# Patient Record
Sex: Female | Born: 1970 | Race: White | Hispanic: No | Marital: Married | Smoking: Light tobacco smoker
Health system: Southern US, Community
[De-identification: ages and names within clinical notes are randomized; demographics above are authoritative.]

## PROBLEM LIST (undated history)

## (undated) DIAGNOSIS — R112 Nausea with vomiting, unspecified: Secondary | ICD-10-CM

## (undated) DIAGNOSIS — N83209 Unspecified ovarian cyst, unspecified side: Secondary | ICD-10-CM

## (undated) DIAGNOSIS — Z789 Other specified health status: Secondary | ICD-10-CM

## (undated) DIAGNOSIS — Z9889 Other specified postprocedural states: Secondary | ICD-10-CM

## (undated) HISTORY — PX: BREAST REDUCTION SURGERY: SHX8

## (undated) HISTORY — PX: TONSILLECTOMY: SUR1361

## (undated) HISTORY — PX: TONSILLECTOMY: SHX5217

---
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
## 1998-07-06 ENCOUNTER — Other Ambulatory Visit: Admission: RE | Admit: 1998-07-06 | Discharge: 1998-07-06 | Payer: Self-pay | Admitting: Obstetrics and Gynecology

## 1998-09-07 ENCOUNTER — Ambulatory Visit (HOSPITAL_COMMUNITY): Admission: RE | Admit: 1998-09-07 | Discharge: 1998-09-07 | Payer: Self-pay

## 2000-04-30 ENCOUNTER — Other Ambulatory Visit: Admission: RE | Admit: 2000-04-30 | Discharge: 2000-04-30 | Payer: Self-pay | Admitting: Obstetrics and Gynecology

## 2006-01-28 ENCOUNTER — Other Ambulatory Visit: Admission: RE | Admit: 2006-01-28 | Discharge: 2006-01-28 | Payer: Self-pay | Admitting: Obstetrics & Gynecology

## 2007-08-02 ENCOUNTER — Inpatient Hospital Stay (HOSPITAL_COMMUNITY): Admission: AD | Admit: 2007-08-02 | Discharge: 2007-08-04 | Payer: Self-pay | Admitting: Obstetrics and Gynecology

## 2007-08-03 ENCOUNTER — Encounter (INDEPENDENT_AMBULATORY_CARE_PROVIDER_SITE_OTHER): Payer: Self-pay | Admitting: Obstetrics and Gynecology

## 2009-03-25 ENCOUNTER — Encounter: Admission: RE | Admit: 2009-03-25 | Discharge: 2009-03-25 | Payer: Self-pay | Admitting: Obstetrics and Gynecology

## 2009-10-19 ENCOUNTER — Encounter: Payer: Self-pay | Admitting: Cardiology

## 2009-12-06 ENCOUNTER — Encounter (INDEPENDENT_AMBULATORY_CARE_PROVIDER_SITE_OTHER): Payer: Self-pay | Admitting: *Deleted

## 2010-08-01 ENCOUNTER — Inpatient Hospital Stay (HOSPITAL_COMMUNITY): Admission: AD | Admit: 2010-08-01 | Discharge: 2010-08-01 | Payer: Self-pay | Admitting: Obstetrics and Gynecology

## 2010-08-06 ENCOUNTER — Inpatient Hospital Stay (HOSPITAL_COMMUNITY): Admission: AD | Admit: 2010-08-06 | Discharge: 2010-08-06 | Payer: Self-pay | Admitting: Obstetrics and Gynecology

## 2010-08-08 ENCOUNTER — Inpatient Hospital Stay (HOSPITAL_COMMUNITY): Admission: AD | Admit: 2010-08-08 | Discharge: 2010-08-09 | Payer: Self-pay | Admitting: Obstetrics and Gynecology

## 2010-08-08 ENCOUNTER — Encounter (INDEPENDENT_AMBULATORY_CARE_PROVIDER_SITE_OTHER): Payer: Self-pay | Admitting: Obstetrics and Gynecology

## 2010-08-09 ENCOUNTER — Encounter: Admission: RE | Admit: 2010-08-09 | Discharge: 2010-08-18 | Payer: Self-pay | Admitting: Obstetrics and Gynecology

## 2010-12-04 ENCOUNTER — Encounter: Payer: Self-pay | Admitting: Obstetrics and Gynecology

## 2010-12-12 NOTE — Letter (Signed)
Summary: Appointment - Missed  [facility]     [facility] [place], [place]    Phone:   Fax:      December 06, 2009 MRN: [id number]   [facility] [address] [place], [place]  [address]   Dear Ms. Katherine Thomas,  Our records indicate you missed your appointment on  12-02-2009   with  Dr.   Daleen Squibb   It is very important that we reach you to reschedule this appointment. We look forward to participating in your health care needs. Please contact us at the number listed above at your earliest convenience to reschedule this appointment.     Sincerely,   Lorne Skeens  [facility] Scheduling Team

## 2010-12-12 NOTE — Letter (Signed)
Summary: [facility] Office Note  [facility] Office Note   Imported By: Roderic Ovens 12/12/2009 16:37:59  _____________________________________________________________________  External Attachment:    Type:   Image     Comment:   External Document

## 2011-01-25 LAB — CBC
HCT: 38.3 % (ref 36.0–46.0)
MCH: 36.3 pg — ABNORMAL HIGH (ref 26.0–34.0)
MCHC: 34.5 g/dL (ref 30.0–36.0)
MCV: 102.7 fL — ABNORMAL HIGH (ref 78.0–100.0)
Platelets: 226 10*3/uL (ref 150–400)
Platelets: 255 10*3/uL (ref 150–400)
RDW: 13.4 % (ref 11.5–15.5)
RDW: 13.7 % (ref 11.5–15.5)
WBC: 18.4 10*3/uL — ABNORMAL HIGH (ref 4.0–10.5)
WBC: 21.6 10*3/uL — ABNORMAL HIGH (ref 4.0–10.5)

## 2011-01-25 LAB — AMNISURE RUPTURE OF MEMBRANE (ROM) NOT AT ARMC: Amnisure ROM: NEGATIVE

## 2011-08-23 LAB — CBC
HCT: 30.7 — ABNORMAL LOW
Hemoglobin: 10.6 — ABNORMAL LOW
Hemoglobin: 12.5
MCHC: 34.5
MCV: 100.9 — ABNORMAL HIGH
Platelets: 282
Platelets: 307
RBC: 3.04 — ABNORMAL LOW
RBC: 3.39 — ABNORMAL LOW
RDW: 14.4 — ABNORMAL HIGH
WBC: 16.4 — ABNORMAL HIGH
WBC: 20.5 — ABNORMAL HIGH

## 2011-08-23 LAB — COMPREHENSIVE METABOLIC PANEL
ALT: 17
AST: 21
Albumin: 2.8 — ABNORMAL LOW
Alkaline Phosphatase: 135 — ABNORMAL HIGH
Chloride: 101
GFR calc Af Amer: >60
Potassium: 3.7
Total Bilirubin: 0.5

## 2011-08-23 LAB — RPR: RPR Ser Ql: NONREACTIVE

## 2011-09-30 ENCOUNTER — Encounter: Payer: Self-pay | Admitting: *Deleted

## 2011-09-30 ENCOUNTER — Emergency Department (HOSPITAL_COMMUNITY)
Admission: EM | Admit: 2011-09-30 | Discharge: 2011-09-30 | Disposition: A | Discharge status: Home / Self Care | Payer: 59 | Attending: Emergency Medicine | Admitting: Emergency Medicine

## 2011-09-30 ENCOUNTER — Other Ambulatory Visit: Payer: Self-pay

## 2011-09-30 DIAGNOSIS — R5381 Other malaise: Secondary | ICD-10-CM | POA: Insufficient documentation

## 2011-09-30 DIAGNOSIS — R42 Dizziness and giddiness: Secondary | ICD-10-CM | POA: Insufficient documentation

## 2011-09-30 DIAGNOSIS — R110 Nausea: Secondary | ICD-10-CM | POA: Insufficient documentation

## 2011-09-30 DIAGNOSIS — R55 Syncope and collapse: Secondary | ICD-10-CM | POA: Insufficient documentation

## 2011-09-30 DIAGNOSIS — R61 Generalized hyperhidrosis: Secondary | ICD-10-CM | POA: Insufficient documentation

## 2011-09-30 MED ORDER — SODIUM CHLORIDE 0.9 % IV BOLUS (SEPSIS)
1000.0000 mL | Freq: Once | INTRAVENOUS | Status: AC
Start: 2011-09-30 — End: 2011-09-30
  Administered 2011-09-30: 1000 mL via INTRAVENOUS

## 2011-09-30 MED ORDER — SODIUM CHLORIDE 0.9 % IV SOLN
1000.0000 mL | INTRAVENOUS | Status: AC
  Administered 2011-09-30: 1000 mL via INTRAVENOUS

## 2011-09-30 MED ORDER — ONDANSETRON HCL 4 MG/2ML IJ SOLN
INTRAMUSCULAR | Status: AC
  Administered 2011-09-30: 4 mg
  Filled 2011-09-30: qty 2

## 2011-09-30 MED ORDER — AEROCHAMBER PLUS W/MASK MISC
Status: AC
  Filled 2011-09-30: qty 1

## 2011-09-30 MED ORDER — ALBUTEROL SULFATE HFA 108 (90 BASE) MCG/ACT IN AERS
INHALATION_SPRAY | RESPIRATORY_TRACT | Status: AC
  Filled 2011-09-30: qty 6.7

## 2011-09-30 NOTE — ED Notes (Signed)
C/o diarrhea x 2 days, avg twice daily. Denies n/v, admits to not drinking enough fluids lately. Also reports "under a lot of stress lately". Denies Cp, palpitations, heart racing, abd pain. 10 min prior to syncopal episode, felt hot, dizzy, lightheaded.

## 2011-09-30 NOTE — ED Provider Notes (Signed)
History     CSN: [id number] Arrival date & time: 09/30/2011  2:34 PM   First MD Initiated Contact with Patient 09/30/11 1452      Chief Complaint  Patient presents with  . 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 She started getting IV fluids from EMS and already has started improving. The episode was gradual onset it has lasted for less than an hour prior to arrival. Her symptoms are improving with IV fluids from EMS. Her symptoms were moderately severe and now mild. No past medical history on file.  No past surgical history on file.  No family history on file.  History  Substance Use Topics  . Smoking status: Not on file  . Smokeless tobacco: Not on file  . Alcohol Use: Not on file    OB History    Grav Para Term Preterm Abortions TAB SAB Ect Mult Living                  Review of Systems  Constitutional: Negative for fever.       10 Systems reviewed and are negative for acute change except as noted in the HPI.  HENT: Negative for congestion.   Eyes: Negative for discharge and redness.  Respiratory: Negative for cough and shortness of breath.   Cardiovascular: Negative for chest pain, palpitations and leg swelling.  Gastrointestinal: Positive for  nausea. Negative for vomiting and abdominal pain.  Musculoskeletal: Negative for back pain.  Skin: Negative for rash.  Neurological: Positive for weakness and light-headedness. Negative for syncope, facial asymmetry, speech difficulty, numbness and headaches.  Psychiatric/Behavioral:       No behavior change.    Allergies  Review of patient's allergies indicates no known allergies.  Home Medications   Current Outpatient Rx  Name Route Sig Dispense Refill  . ACETAMINOPHEN 325 MG PO TABS Oral Take 650 mg by mouth once.        BP 123/84  Pulse 94  Temp(Src) 97.5 F (36.4 C) (Oral)  Resp 21  SpO2 100%  Physical Exam  Nursing note and vitals reviewed. Constitutional: She is oriented to person, place, and time.       Awake, alert, nontoxic appearance with baseline speech for patient.  HENT:  Head: Atraumatic.  Mouth/Throat: No oropharyngeal exudate.  Eyes: EOM are normal. Pupils are equal, round, and reactive to light. Right eye exhibits no discharge. Left eye exhibits no discharge.  Neck: Neck supple.  Cardiovascular: Normal rate and regular rhythm.   No murmur heard. Pulmonary/Chest: Effort normal and breath sounds normal. No stridor. No respiratory distress. She has no wheezes. She has no rales. She exhibits no tenderness.  Abdominal: Soft. Bowel sounds are normal. She exhibits no mass. There is no tenderness. There is no rebound.  Musculoskeletal: She exhibits no tenderness.       Baseline ROM, moves  extremities with no obvious new focal weakness.  Lymphadenopathy:    She has no cervical adenopathy.  Neurological: She is alert and oriented to person, place, and time.       Awake, alert, cooperative and aware of situation; motor strength bilaterally; sensation normal to light touch bilaterally; peripheral visual fields full to confrontation; no facial asymmetry; tongue midline; major cranial nerves appear intact; no pronator drift, normal finger to nose bilaterally  Skin: No  rash noted.  Psychiatric: She has a normal mood and affect.    ED Course  Procedures (including critical care time) ECG: Sinus rhythm, ventricular rate 86 beats per minute, normal axis, normal intervals, no acute ischemic changes noted, no comparison ECG available.  Pt feels improved after observation and/or treatment in ED.Patient / Familyinformed of clinical course, understand medical decision-making process, and agree with plan. Labs Reviewed - No data to display No results found.   1. Near syncope       MDM  I doubt any other EMC precluding discharge at this time including, but not necessarily limited to the following:SAH, CVA, ACS, SBI.        Hurman Horn, MD 09/30/11 [phone]

## 2011-09-30 NOTE — ED Notes (Signed)
Reports dizziness & lightheadedness improved after fluids. Denies pain.Marland Kitchen NAD. VSS

## 2011-09-30 NOTE — ED Notes (Signed)
Reports nausea upon standing, denies dizziness/lightheadedness. Medicated per order

## 2012-08-13 ENCOUNTER — Ambulatory Visit (INDEPENDENT_AMBULATORY_CARE_PROVIDER_SITE_OTHER): Payer: 59 | Admitting: Cardiology

## 2012-08-13 ENCOUNTER — Other Ambulatory Visit: Payer: Self-pay | Admitting: *Deleted

## 2012-08-13 ENCOUNTER — Encounter: Payer: Self-pay | Admitting: Cardiology

## 2012-08-13 VITALS — BP 144/94 | HR 93 | Ht 67.0 in | Wt 126.0 lb

## 2012-08-13 DIAGNOSIS — I10 Essential (primary) hypertension: Secondary | ICD-10-CM

## 2012-08-13 DIAGNOSIS — R002 Palpitations: Secondary | ICD-10-CM

## 2012-08-13 DIAGNOSIS — R079 Chest pain, unspecified: Secondary | ICD-10-CM

## 2012-08-13 MED ORDER — TRAZODONE HCL 50 MG PO TABS: ORAL_TABLET | ORAL | Status: DC

## 2012-08-13 NOTE — Patient Instructions (Addendum)
Your physician has requested that you have an echocardiogram. Echocardiography is a painless test that uses sound waves to create images of your heart. It provides your doctor with information about the size and shape of your heart and how well your heart's chambers and valves are working. This procedure takes approximately one hour. There are no restrictions for this procedure.  Your physician has requested that you have a stress echocardiogram. For further information please visit [email or web address] Please follow instruction sheet as given.  Your physician has recommended that you wear a holter monitor. Holter monitors are medical devices that record the heart's electrical activity. Doctors most often use these monitors to diagnose arrhythmias. Arrhythmias are problems with the speed or rhythm of the heartbeat. The monitor is a small, portable device. You can wear one while you do your normal daily activities. This is usually used to diagnose what is causing palpitations/syncope (passing out). 48 hour monitor  Your physician recommends that you return for a FASTING NMR lipoprofile profile.   Use trazodone 50mg  at bedtime for sleep as you need it. Additional refills should be discussed with your primary care or other provider.   Your physician recommends that you schedule a follow-up appointment in: 2 weeks with Dr Shirlee Latch after all testing has been completed.

## 2012-08-13 NOTE — Progress Notes (Signed)
Patient ID: Katherine Thomas, female   DOB: [birth year], 41 y.o.   MRN: [id number] 41 yo with history of palpitations and left shoulder pain presents for cardiology evaluation.  Patient has had a long history of periodic palpitations (like her heart fluttering).  However, for the last few months, she has been having more frequent palpitations.  She will feel her heart "flutter," and sometimes she will feel her heart beat "hard."  She denies tachycardia.  Palpitations are bothersome.  No lightheadedness or syncope.  She did have an episode of presyncope in 11/12 at the [facility] (felt warm, diaphoretic, lightheaded, nauseated).    Additionally, she has been having pain in her left shoulder radiating down her left arm and into her chest . This will last 10 minutes at a time.  It does not seem to be related to exertion.  She does not get exertional chest pain or dyspnea.  She walks on the treadmill at the gym and lifts weights for exercise.    Patient has been under a lot of stress recently.  Her sister died not long ago of a subarachnoid hemorrhage.  She has 2 young children.  She only sleeps 4-5 hours/night.  She drinks about 5 cups of coffee/day.    ECG: NSR, normal  PMH: 1. Palpitations 2. Presyncope (11/12): presumed vasovagal.   SH: Married with 2 children.  Works as an Nutritional therapist.  Smoked 1/2 ppd, quit in 9/13.    FH: Father with massive MI at 18 (died).  Sister with SAH at 82.  Mother witih AAA repair.  Grandmother and grandfather both had atrial fibrillation.   ROS: All systems reviewed and negative except as per HPI.   Current Outpatient Prescriptions  Medication Sig Dispense Refill  . acetaminophen (TYLENOL) 325 MG tablet Take 650 mg by mouth every 6 (six) hours as needed.       Marland Kitchen aspirin 81 MG tablet Take 81 mg by mouth daily.      . traZODone (DESYREL) 50 MG tablet 1 at bedtime as needed for sleep  30 tablet  0    BP 144/94  Pulse 93  Ht 5\' 7"  (1.702 m)  Wt 126 lb (57.153  kg)  BMI 19.73 kg/m2  SpO2 96% General: NAD Neck: No JVD, no thyromegaly or thyroid nodule.  Lungs: Clear to auscultation bilaterally with normal respiratory effort. CV: Nondisplaced PMI.  Heart regular S1/S2, no S3/S4, no murmur.  No peripheral edema.  No carotid bruit.  Normal pedal pulses.  Abdomen: Soft, nontender, no hepatosplenomegaly, no distention.  Skin: Intact without lesions or rashes.  Neurologic: Alert and oriented x 3.  Psych: Normal affect. Extremities: No clubbing or cyanosis.  HEENT: Normal.    Assessment/Plan: 1. Palpitations: Her symptoms sound like PVCs or PACs.  Likely they have been triggered by increased stress, lack of sleep, excessive caffeine.  She does have a family history of atrial fibrillation.  - 48 hour holter monitor to confirm premature beats, make sure we don't see atrial fibrillation.  - Cut back on caffeine, try to exercise and sleep more.  She wants something to help with sleep but had side effects with Ambien and had no improvement with melatonin.  I will let her try trazodone 50 mg daily.  - Check TSH.  - Echocardiogram. 2. Chest/left arm/shoulder pain: Atypical.  However, patient does have a strong family history of CAD.  I will have her get a stress echo.  I will also get a Lipomed profile.  3. High blood pressure:  No history of HTN.  Suspect that this may be a stress response to coming to the doctor's office.   Followup in 2 wks.

## 2012-08-14 ENCOUNTER — Encounter: Payer: Self-pay | Admitting: Cardiology

## 2012-08-14 ENCOUNTER — Ambulatory Visit (HOSPITAL_COMMUNITY): Payer: BC Managed Care – PPO | Attending: Cardiology | Admitting: Radiology

## 2012-08-14 ENCOUNTER — Telehealth: Payer: Self-pay | Admitting: Cardiology

## 2012-08-14 ENCOUNTER — Other Ambulatory Visit: Payer: BC Managed Care – PPO

## 2012-08-14 ENCOUNTER — Other Ambulatory Visit (INDEPENDENT_AMBULATORY_CARE_PROVIDER_SITE_OTHER): Payer: BC Managed Care – PPO

## 2012-08-14 DIAGNOSIS — R079 Chest pain, unspecified: Secondary | ICD-10-CM

## 2012-08-14 DIAGNOSIS — R002 Palpitations: Secondary | ICD-10-CM

## 2012-08-14 DIAGNOSIS — I379 Nonrheumatic pulmonary valve disorder, unspecified: Secondary | ICD-10-CM | POA: Insufficient documentation

## 2012-08-14 NOTE — Progress Notes (Signed)
Echocardiogram performed.  

## 2012-08-14 NOTE — Telephone Encounter (Signed)
New Problem: ° ° ° °Patient called in returning Anne's call.  Please call back. °

## 2012-08-14 NOTE — Telephone Encounter (Signed)
Talked with pt and she thought Thurston Hole might have been calling b/c she added a tsh to her lab work today. She has had labs completed. No further questions Mylo Red RN

## 2012-08-19 ENCOUNTER — Other Ambulatory Visit: Payer: Self-pay | Admitting: *Deleted

## 2012-08-19 DIAGNOSIS — R079 Chest pain, unspecified: Secondary | ICD-10-CM

## 2012-08-20 ENCOUNTER — Other Ambulatory Visit: Payer: Self-pay | Admitting: *Deleted

## 2012-08-20 DIAGNOSIS — I10 Essential (primary) hypertension: Secondary | ICD-10-CM

## 2012-08-20 DIAGNOSIS — R079 Chest pain, unspecified: Secondary | ICD-10-CM

## 2012-08-20 DIAGNOSIS — R002 Palpitations: Secondary | ICD-10-CM

## 2012-08-20 MED ORDER — ROSUVASTATIN CALCIUM 5 MG PO TABS: 5.0000 mg | ORAL_TABLET | Freq: Every day | ORAL | Status: DC

## 2012-08-22 ENCOUNTER — Other Ambulatory Visit (HOSPITAL_COMMUNITY): Payer: BC Managed Care – PPO

## 2012-08-29 ENCOUNTER — Ambulatory Visit: Payer: BC Managed Care – PPO | Admitting: Cardiology

## 2012-09-08 ENCOUNTER — Encounter: Payer: BC Managed Care – PPO | Admitting: Physician Assistant

## 2012-09-08 ENCOUNTER — Encounter (INDEPENDENT_AMBULATORY_CARE_PROVIDER_SITE_OTHER): Payer: BC Managed Care – PPO

## 2012-09-08 ENCOUNTER — Ambulatory Visit (INDEPENDENT_AMBULATORY_CARE_PROVIDER_SITE_OTHER): Payer: BC Managed Care – PPO | Admitting: Physician Assistant

## 2012-09-08 DIAGNOSIS — R079 Chest pain, unspecified: Secondary | ICD-10-CM

## 2012-09-08 DIAGNOSIS — R002 Palpitations: Secondary | ICD-10-CM

## 2012-09-08 NOTE — Procedures (Signed)
Exercise Treadmill Test  Pre-Exercise Testing Evaluation Rhythm: normal sinus  Rate: 90   PR:  .14 QRS:  .08  QT:  .36 QTc: .44     Test  Exercise Tolerance Test Ordering MD: Marca Ancona, MD  Interpreting MD: Tereso Newcomer PA-C  Unique Test No: 1  Treadmill:  1  Indication for ETT: Chest Pain  Contraindication to ETT: No   Stress Modality: exercise - treadmill  Cardiac Imaging Performed: non   Protocol: standard Bruce - maximal  Max BP:  172/104  Max MPHR (bpm):  178 85% MPR (bpm):  151  MPHR obtained (bpm):  153 % MPHR obtained:  88%  Reached 85% MPHR (min:sec):  8:12 Total Exercise Time (min-sec):  10:00  Workload in METS:  11.7 Borg Scale: 15  Reason ETT Terminated:  patient's desire to stop    ST Segment Analysis At Rest: normal ST segments - no evidence of significant ST depression With Exercise: no evidence of significant ST depression  Other Information Arrhythmia:  No Angina during ETT:  absent (0) Quality of ETT:  diagnostic  ETT Interpretation:  normal - no evidence of ischemia by ST analysis  Comments: Good exercise tolerance. No chest pain. Normal BP response to exercise. No ST-T changes to suggest ischemia.  Recommendations: Results d/w Elveria Rising. Follow up with Dr. Marca Ancona as directed. Signed,  Tereso Newcomer, PA-C  11:11 AM 09/08/2012

## 2012-09-15 ENCOUNTER — Telehealth: Payer: Self-pay | Admitting: *Deleted

## 2012-09-15 NOTE — Telephone Encounter (Signed)
FYI Elveria Rising told me at her ETT that she stopped Crestor due to myalgias. Tereso Newcomer, PA-C 11:15 AM 09/08/2012

## 2012-09-15 NOTE — Telephone Encounter (Signed)
Dr Shirlee Latch reviewed monitor done 09/08/12-09/10/12. Rare PACs/ no PVCs/ no atrial fib. No major abnormal findings. Pt advised /verbalized understanding.

## 2012-09-26 ENCOUNTER — Encounter: Payer: Self-pay | Admitting: *Deleted

## 2012-09-29 ENCOUNTER — Ambulatory Visit: Payer: BC Managed Care – PPO | Admitting: Cardiology

## 2012-10-29 ENCOUNTER — Other Ambulatory Visit: Payer: BC Managed Care – PPO

## 2013-06-30 ENCOUNTER — Other Ambulatory Visit: Payer: Self-pay | Admitting: Obstetrics and Gynecology

## 2013-07-03 ENCOUNTER — Encounter (HOSPITAL_COMMUNITY): Payer: Self-pay | Admitting: *Deleted

## 2013-07-03 ENCOUNTER — Inpatient Hospital Stay (HOSPITAL_COMMUNITY)
Admission: AD | Admit: 2013-07-03 | Discharge: 2013-07-03 | Disposition: A | Discharge status: Home / Self Care | Payer: BC Managed Care – PPO | Source: Ambulatory Visit | Attending: Obstetrics and Gynecology | Admitting: Obstetrics and Gynecology

## 2013-07-03 DIAGNOSIS — D649 Anemia, unspecified: Secondary | ICD-10-CM | POA: Insufficient documentation

## 2013-07-03 DIAGNOSIS — R5381 Other malaise: Secondary | ICD-10-CM | POA: Insufficient documentation

## 2013-07-03 HISTORY — DX: Nausea with vomiting, unspecified: R11.2

## 2013-07-03 HISTORY — DX: Other specified postprocedural states: Z98.890

## 2013-07-03 HISTORY — DX: Unspecified ovarian cyst, unspecified side: N83.209

## 2013-07-03 HISTORY — DX: Other specified health status: Z78.9

## 2013-07-03 LAB — CBC WITH DIFFERENTIAL/PLATELET
Basophils Absolute: 0 10*3/uL (ref 0.0–0.1)
Eosinophils Absolute: 0.1 10*3/uL (ref 0.0–0.7)
Eosinophils Relative: 2 % (ref 0–5)
MCH: 32.7 pg (ref 26.0–34.0)
MCV: 97.7 fL (ref 78.0–100.0)
Monocytes Absolute: 0.5 10*3/uL (ref 0.1–1.0)
Platelets: 318 10*3/uL (ref 150–400)
RDW: 14 % (ref 11.5–15.5)

## 2013-07-03 NOTE — MAU Note (Signed)
Pt reports she had a D&C with novasure. Vagina bleeding has stopped but pt feels very weak and dizzy. States she can hear her pulse in her ears and has not slept well for 2 days

## 2013-07-03 NOTE — MAU Provider Note (Signed)
History     CSN: [id number]  Arrival date and time: 07/03/13 1515   First Provider Initiated Contact with Patient 07/03/13 1708      Chief Complaint  Patient presents with  . Fatigue   HPI Katherine Thomas is 42 y.o. [phone] presenting with post op weakness and fatigue.  Had IUD removed early August, no bleeding for the first few days and then she began with heavy bleeding with clots for 8 days.  She saw Dr. Charlynne Pander her hgb and iron were low.  Recommended D&C.  This was done 8/19 with Novasure at the same time by Dr. Rana Snare at surgical center.  Has near syncopal episodes with breaking out in a sweat, ringing of ears.  She is feeling worse-with now hearing her pulse in her ears.  States bleeding has close to stop--a spot today.  Denies abdominal pain, fever or chills.  Took Iron supplement today.    Past Medical History  Diagnosis Date  . Medical history non-contributory   . Ovarian cyst   . PONV (postoperative nausea and vomiting)     Past Surgical History  Procedure Laterality Date  . Breast reduction surgery  1989  . Tonsillectomy    . Breast reduction surgery    . Tonsillectomy      Family History  Problem Relation Age of Onset  . AAA (abdominal aortic aneurysm) Mother   . Heart attack Father 19  . Heart Problems Paternal Grandmother   . Heart Problems Paternal Grandfather   . Heart attack Paternal Aunt 47    CABG    History  Substance Use Topics  . Smoking status: Light Tobacco Smoker  . Smokeless tobacco: Not on file  . Alcohol Use: 0.6 oz/week    1 Glasses of wine per week     Comment: occasional    Allergies:  Allergies  Allergen Reactions  . Percocet [Oxycodone-Acetaminophen] Nausea Only    Patient does not plan to ever take this medication again    Prescriptions prior to admission  Medication Sig Dispense Refill  . acetaminophen (TYLENOL) 325 MG tablet Take 650 mg by mouth every 6 (six) hours as needed for pain.       . IRON PO Take 2 tablets by  mouth daily.      . Multiple Vitamin (MULTIVITAMIN WITH MINERALS) TABS tablet Take 1 tablet by mouth daily.        Review of Systems  Constitutional: Negative for fever.  Gastrointestinal: Negative for nausea, vomiting and abdominal pain.   Physical Exam   Blood pressure 116/72, pulse 94, temperature 97.9 F (36.6 C), temperature source Oral, resp. rate 18, height 5\' 7"  (1.702 m), weight 129 lb 3.2 oz (58.605 kg), SpO2 100.00%.  Physical Exam  Vitals reviewed. Constitutional: She is oriented to person, place, and time. She appears well-developed and well-nourished.  Cardiovascular: Normal rate.   Respiratory: Effort normal.  Genitourinary:  Not indicated  Neurological: She is alert and oriented to person, place, and time.  Skin: Skin is warm and dry. There is pallor.  Psychiatric: She has a normal mood and affect. Her behavior is normal.   Results for orders placed during the hospital encounter of 07/03/13 (from the past 24 hour(s))  CBC WITH DIFFERENTIAL     Status: Abnormal   Collection Time    07/03/13  5:16 PM      Result Value Range   WBC 7.4  4.0 - 10.5 K/uL   RBC 2.66 (*) 3.87 - 5.11  MIL/uL   Hemoglobin 8.7 (*) 12.0 - 15.0 g/dL   HCT 16.1 (*) 09.6 - 04.5 %   MCV 97.7  78.0 - 100.0 fL   MCH 32.7  26.0 - 34.0 pg   MCHC 33.5  30.0 - 36.0 g/dL   RDW 40.9  81.1 - 91.4 %   Platelets 318  150 - 400 K/uL   Neutrophils Relative % 44  43 - 77 %   Neutro Abs 3.3  1.7 - 7.7 K/uL   Lymphocytes Relative 47 (*) 12 - 46 %   Lymphs Abs 3.5  0.7 - 4.0 K/uL   Monocytes Relative 7  3 - 12 %   Monocytes Absolute 0.5  0.1 - 1.0 K/uL   Eosinophils Relative 2  0 - 5 %   Eosinophils Absolute 0.1  0.0 - 0.7 K/uL   Basophils Relative 0  0 - 1 %   Basophils Absolute 0.0  0.0 - 0.1 K/uL   MAU Course  Procedures  MDM 18:10  Reported MSE , labs and vital signs to Dr. Arelia Sneddon   May discharge to home with instructions to continue Fe Replacement and follow up with Dr. Rana Snare on Monday am.     Assessment and Plan  A:  Malaise      Anemia     Post-op D&C and Novasure  P:  Patient instructed to continue Fe Replacement     Follow up with Dr. Rana Snare on Monday  Matt Holmes 07/03/2013, 6:12 PM

## 2014-09-13 ENCOUNTER — Encounter (HOSPITAL_COMMUNITY): Payer: Self-pay | Admitting: *Deleted

## 2021-05-09 ENCOUNTER — Emergency Department (HOSPITAL_BASED_OUTPATIENT_CLINIC_OR_DEPARTMENT_OTHER)
Admission: EM | Admit: 2021-05-09 | Discharge: 2021-05-09 | Disposition: A | Discharge status: Home / Self Care | Payer: 59 | Attending: Emergency Medicine | Admitting: Emergency Medicine

## 2021-05-09 ENCOUNTER — Emergency Department (HOSPITAL_BASED_OUTPATIENT_CLINIC_OR_DEPARTMENT_OTHER): Payer: 59

## 2021-05-09 ENCOUNTER — Encounter (HOSPITAL_BASED_OUTPATIENT_CLINIC_OR_DEPARTMENT_OTHER): Payer: Self-pay | Admitting: *Deleted

## 2021-05-09 ENCOUNTER — Other Ambulatory Visit: Payer: Self-pay

## 2021-05-09 DIAGNOSIS — R1110 Vomiting, unspecified: Secondary | ICD-10-CM | POA: Diagnosis not present

## 2021-05-09 DIAGNOSIS — I10 Essential (primary) hypertension: Secondary | ICD-10-CM | POA: Diagnosis not present

## 2021-05-09 DIAGNOSIS — R42 Dizziness and giddiness: Secondary | ICD-10-CM

## 2021-05-09 DIAGNOSIS — F17200 Nicotine dependence, unspecified, uncomplicated: Secondary | ICD-10-CM | POA: Insufficient documentation

## 2021-05-09 LAB — CBC
HCT: 45.4 % (ref 36.0–46.0)
Hemoglobin: 15.4 g/dL — ABNORMAL HIGH (ref 12.0–15.0)
MCH: 31.9 pg (ref 26.0–34.0)
MCHC: 33.9 g/dL (ref 30.0–36.0)
MCV: 94 fL (ref 80.0–100.0)
Platelets: 362 10*3/uL (ref 150–400)
RBC: 4.83 MIL/uL (ref 3.87–5.11)
RDW: 11.9 % (ref 11.5–15.5)
WBC: 8.6 10*3/uL (ref 4.0–10.5)
nRBC: 0 % (ref 0.0–0.2)

## 2021-05-09 LAB — URINALYSIS, ROUTINE W REFLEX MICROSCOPIC
Bilirubin Urine: NEGATIVE
Glucose, UA: NEGATIVE mg/dL
Ketones, ur: 15 mg/dL — AB
Leukocytes,Ua: NEGATIVE
Nitrite: NEGATIVE
Protein, ur: NEGATIVE mg/dL
Specific Gravity, Urine: 1.011 (ref 1.005–1.030)
pH: 7 (ref 5.0–8.0)

## 2021-05-09 LAB — COMPREHENSIVE METABOLIC PANEL
ALT: 33 U/L (ref 0–44)
AST: 19 U/L (ref 15–41)
Albumin: 4.3 g/dL (ref 3.5–5.0)
Alkaline Phosphatase: 52 U/L (ref 38–126)
Anion gap: 7 (ref 5–15)
BUN: 16 mg/dL (ref 6–20)
CO2: 25 mmol/L (ref 22–32)
Calcium: 9 mg/dL (ref 8.9–10.3)
Chloride: 104 mmol/L (ref 98–111)
Creatinine, Ser: 0.63 mg/dL (ref 0.44–1.00)
GFR, Estimated: >60 mL/min (ref >60)
Glucose, Bld: 90 mg/dL (ref 70–99)
Potassium: 4.1 mmol/L (ref 3.5–5.1)
Sodium: 136 mmol/L (ref 135–145)
Total Bilirubin: 0.8 mg/dL (ref 0.3–1.2)
Total Protein: 7.2 g/dL (ref 6.5–8.1)

## 2021-05-09 LAB — LIPASE, BLOOD: Lipase: 15 U/L (ref 11–51)

## 2021-05-09 LAB — PREGNANCY, URINE: Preg Test, Ur: NEGATIVE

## 2021-05-09 MED ORDER — ONDANSETRON HCL 4 MG/2ML IJ SOLN
4.0000 mg | Freq: Once | INTRAMUSCULAR | Status: AC | PRN
  Administered 2021-05-09: 4 mg via INTRAVENOUS

## 2021-05-09 MED ORDER — MECLIZINE HCL 25 MG PO TABS
25.0000 mg | ORAL_TABLET | Freq: Once | ORAL | Status: AC
  Administered 2021-05-09: 25 mg via ORAL
  Filled 2021-05-09: qty 1

## 2021-05-09 MED ORDER — MECLIZINE HCL 25 MG PO TABS: 25.0000 mg | ORAL_TABLET | Freq: Three times a day (TID) | ORAL | 0 refills | Status: AC | PRN

## 2021-05-09 MED ORDER — ONDANSETRON HCL 4 MG/2ML IJ SOLN
4.0000 mg | Freq: Once | INTRAMUSCULAR | Status: AC
  Administered 2021-05-09: 4 mg via INTRAVENOUS

## 2021-05-09 MED ORDER — SODIUM CHLORIDE 0.9 % IV BOLUS
1000.0000 mL | Freq: Once | INTRAVENOUS | Status: AC
  Administered 2021-05-09: 1000 mL via INTRAVENOUS

## 2021-05-09 NOTE — Discharge Instructions (Addendum)
Work-up and symptoms seem to be consistent with benign positional vertigo.  Make an appointment to follow-up with your primary care doctor.  Take the Antivert as directed.  Return for any new or worse symptoms.

## 2021-05-09 NOTE — ED Provider Notes (Signed)
[facility] EMERGENCY DEPT Provider Note   CSN: [id number] Arrival date & time: 05/09/21  1742     History Chief Complaint  Patient presents with   Dizziness   Emesis    Katherine Thomas is a 50 y.o. female.  Patient and family had COVID 3 weeks ago.  She feels completely recovered from that.  She started with room spinning and dizziness related with vomiting.  No hearing changes.  No visual changes no extremity weakness or numbness.  No speech problems.  Triage mentions some diarrhea this morning.  But certainly no persistent diarrhea.  The symptoms are improved if she lays completely still.  They actually resolved.  If she moves her head left or right they come back on.  Patient is never had anything like this before.      Past Medical History:  Diagnosis Date   Medical history non-contributory    Ovarian cyst    PONV (postoperative nausea and vomiting)     Patient Active Problem List   Diagnosis Date Noted   Palpitations 08/13/2012   Chest pain 08/13/2012   High blood pressure 08/13/2012    Past Surgical History:  Procedure Laterality Date   BREAST REDUCTION SURGERY  1989   BREAST REDUCTION SURGERY     TONSILLECTOMY     TONSILLECTOMY       OB History     Gravida  2   Para  2   Term  2   Preterm      AB      Living  2      SAB      IAB      Ectopic      Multiple      Live Births              Family History  Problem Relation Age of Onset   AAA (abdominal aortic aneurysm) Mother    Heart attack Father 62   Heart Problems Paternal Grandmother    Heart Problems Paternal Grandfather    Heart attack Paternal Aunt 37       CABG    Social History   Tobacco Use   Smoking status: Light Smoker    Pack years: 0.00   Smokeless tobacco: Never  Substance Use Topics   Alcohol use: Yes    Alcohol/week: 1.0 standard drink    Types: 1 Glasses of wine per week    Comment: occasional   Drug use: No    Home Medications Prior  to Admission medications   Medication Sig Start Date End Date Taking? Authorizing Provider  acetaminophen (TYLENOL) 325 MG tablet Take 650 mg by mouth every 6 (six) hours as needed for pain.    Yes [provider]  ibuprofen (ADVIL) 400 MG tablet Take 400 mg by mouth every 6 (six) hours as needed.   Yes [provider]  meclizine (ANTIVERT) 25 MG tablet Take 1 tablet (25 mg total) by mouth 3 (three) times daily as needed for dizziness. 05/09/21  Yes Vanetta Mulders, MD  Multiple Vitamin (MULTIVITAMIN WITH MINERALS) TABS tablet Take 1 tablet by mouth daily.   Yes [provider]  IRON PO Take 2 tablets by mouth daily.    [provider]    Allergies    Percocet [oxycodone-acetaminophen]  Review of Systems   Review of Systems  Constitutional:  Negative for chills and fever.  HENT:  Negative for ear pain and sore throat.   Eyes:  Negative for  pain and visual disturbance.  Respiratory:  Negative for cough and shortness of breath.   Cardiovascular:  Negative for chest pain and palpitations.  Gastrointestinal:  Positive for nausea and vomiting. Negative for abdominal pain.  Genitourinary:  Negative for dysuria and hematuria.  Musculoskeletal:  Negative for arthralgias and back pain.  Skin:  Negative for color change and rash.  Neurological:  Positive for dizziness. Negative for seizures and syncope.  All other systems reviewed and are negative.  Physical Exam Updated Vital Signs BP (!) 143/94 (BP Location: Left Arm)   Pulse 68   Temp 98.4 F (36.9 C)   Resp 13   Ht 1.702 m (5\' 7" )   Wt 54.4 kg   SpO2 99%   BMI 18.79 kg/m   Physical Exam Vitals and nursing note reviewed.  Constitutional:      General: She is not in acute distress.    Appearance: Normal appearance. She is well-developed.  HENT:     Head: Normocephalic and atraumatic.     Right Ear: Tympanic membrane and ear canal normal.     Left Ear: Tympanic membrane and ear canal normal.      Mouth/Throat:     Mouth: Mucous membranes are moist.  Eyes:     Extraocular Movements: Extraocular movements intact.     Conjunctiva/sclera: Conjunctivae normal.     Pupils: Pupils are equal, round, and reactive to light.  Cardiovascular:     Rate and Rhythm: Normal rate and regular rhythm.     Heart sounds: No murmur heard. Pulmonary:     Effort: Pulmonary effort is normal. No respiratory distress.     Breath sounds: Normal breath sounds.  Abdominal:     Palpations: Abdomen is soft.     Tenderness: There is no abdominal tenderness.  Musculoskeletal:        General: No swelling. Normal range of motion.     Cervical back: Normal range of motion and neck supple. No rigidity.  Skin:    General: Skin is warm and dry.     Capillary Refill: Capillary refill takes less than 2 seconds.  Neurological:     General: No focal deficit present.     Mental Status: She is alert and oriented to person, place, and time.     Cranial Nerves: No cranial nerve deficit.     Sensory: No sensory deficit.     Motor: No weakness.     Coordination: Coordination normal.     Comments: The dizziness and room spinning is reproducible by moving her head to the right and then back to the left.    ED Results / Procedures / Treatments   Labs (all labs ordered are listed, but only abnormal results are displayed) Labs Reviewed  CBC - Abnormal; Notable for the following components:      Result Value   Hemoglobin 15.4 (*)    All other components within normal limits  URINALYSIS, ROUTINE W REFLEX MICROSCOPIC - Abnormal; Notable for the following components:   Hgb urine dipstick TRACE (*)    Ketones, ur 15 (*)    Bacteria, UA RARE (*)    All other components within normal limits  LIPASE, BLOOD  COMPREHENSIVE METABOLIC PANEL  PREGNANCY, URINE    EKG EKG Interpretation  Date/Time:  Tuesday May 09 2021 18:04:47 EDT Ventricular Rate:  72 PR Interval:  156 QRS Duration: 82 QT Interval:  384 QTC  Calculation: 421 R Axis:   80 Text Interpretation: Sinus rhythm RAE, consider biatrial  enlargement Confirmed by Vanetta Mulders [phone]) on 05/09/2021 6:16:26 PM  Radiology CT Head Wo Contrast  Result Date: 05/09/2021 CLINICAL DATA:  50 year old female with dizziness and neurologic deficit. EXAM: CT HEAD WITHOUT CONTRAST TECHNIQUE: Contiguous axial images were obtained from the base of the skull through the vertex without intravenous contrast. COMPARISON:  None. FINDINGS: Brain: The ventricles and sulci appropriate size for patient's age. The gray-white matter discrimination is preserved. Small low attenuating foci in the region of the basal ganglia bilaterally, likely perivascular spaces. There is no acute intracranial hemorrhage. No mass effect midline shift. No extra-axial fluid collection. Vascular: No hyperdense vessel or unexpected calcification. Skull: Normal. Negative for fracture or focal lesion. Sinuses/Orbits: There is diffuse mucoperiosteal thickening of paranasal sinuses. No air-fluid level. Mastoid air cells are clear. Other: None IMPRESSION: 1. No acute intracranial pathology. 2. Paranasal sinus disease. Electronically Signed   By: Elgie Collard M.D.   On: 05/09/2021 19:22    Procedures Procedures   Medications Ordered in ED Medications  ondansetron (ZOFRAN) injection 4 mg (4 mg Intravenous Given 05/09/21 1827)  ondansetron (ZOFRAN) injection 4 mg (4 mg Intravenous Given 05/09/21 1814)  meclizine (ANTIVERT) tablet 25 mg (25 mg Oral Given 05/09/21 1831)  sodium chloride 0.9 % bolus 1,000 mL (0 mLs Intravenous Stopped 05/09/21 2001)    ED Course  I have reviewed the triage vital signs and the nursing notes.  Pertinent labs & imaging results that were available during my care of the patient were reviewed by me and considered in my medical decision making (see chart for details).    MDM Rules/Calculators/A&P                          Work-up here without any acute findings.   Urinalysis negative.  Pregnancy test negative.  Lipase normal.  Complete metabolic panel normal.  CBC without a leukocytosis.  Hemoglobin is 15.4.  CT scan of the head without any acute findings.  There is paraspinous disease.  But there is no air-fluid levels nothing suggestive of an acute infection.  Symptoms most likely consistent with benign positional vertigo.  We will have her follow-up with primary care doctor.  Patient treated here with IV fluids and Antivert and feels much better.  We will continue out patient Antivert.  Patient will return for any new or worse symptoms.   Final Clinical Impression(s) / ED Diagnoses Final diagnoses:  Vertigo    Rx / DC Orders ED Discharge Orders          Ordered    meclizine (ANTIVERT) 25 MG tablet  3 times daily PRN        05/09/21 2041             Vanetta Mulders, MD 05/09/21 2046

## 2021-05-09 NOTE — ED Triage Notes (Signed)
Reports dizziness for aboug 3 weeks, emesis started today.Diarrhea this am, Appetite decreased.

## 2023-03-02 IMAGING — CT CT HEAD W/O CM
4 series · 17 of 47 positions shown, 19 images · non-contrast
Comparison: None.

CLINICAL DATA: 50-year-old female with dizziness and neurologic
deficit.

EXAM:
CT HEAD WITHOUT CONTRAST
TECHNIQUE: Contiguous axial images were obtained from the base of the skull
through the vertex without intravenous contrast.

[Series 2: head wo · axial · 0.42mm/px · z∈[-135,-15]mm · 7 of 32 slices shown, 9 images]
[im 4/32  brain]
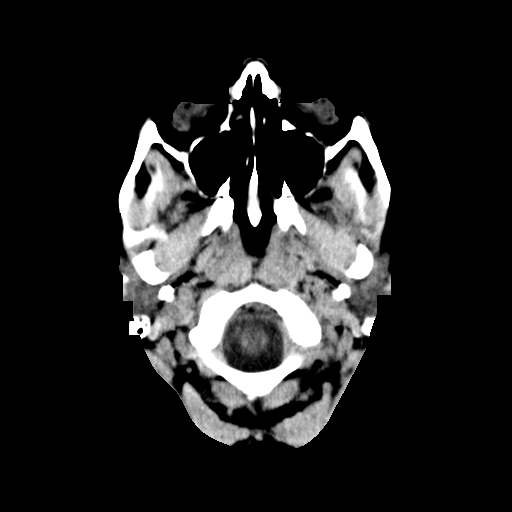
[im 4/32  bone]
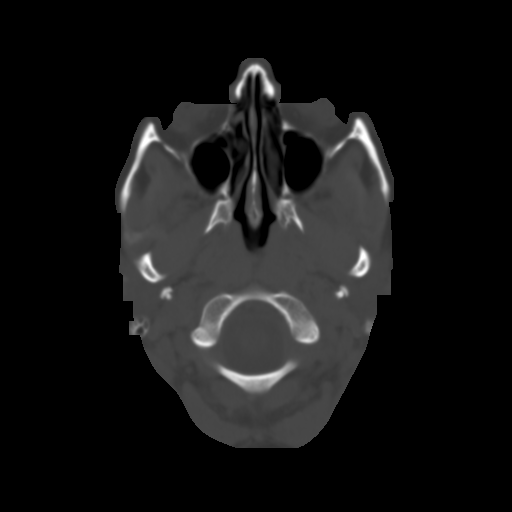
[im 8/32  brain]
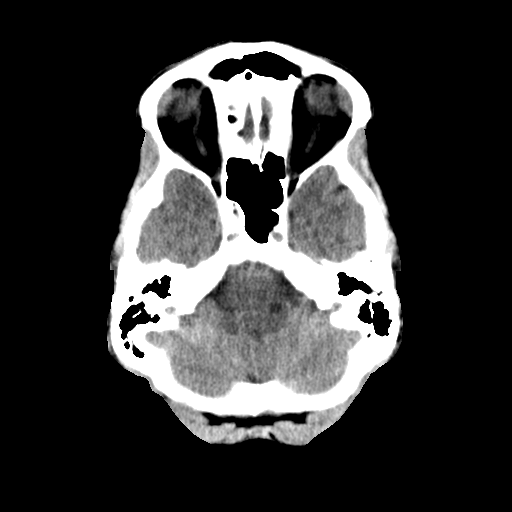
[im 12/32  brain]
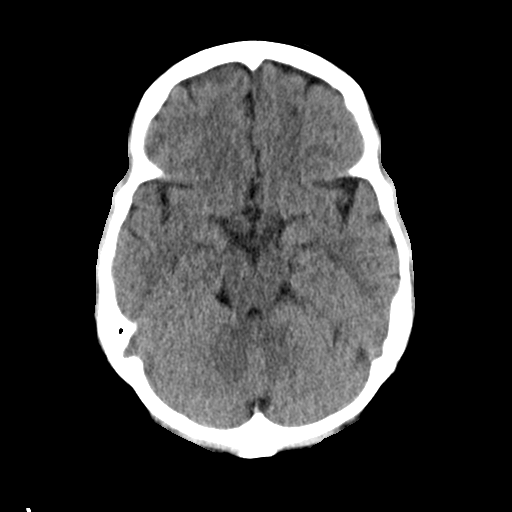
[im 16/32  brain]
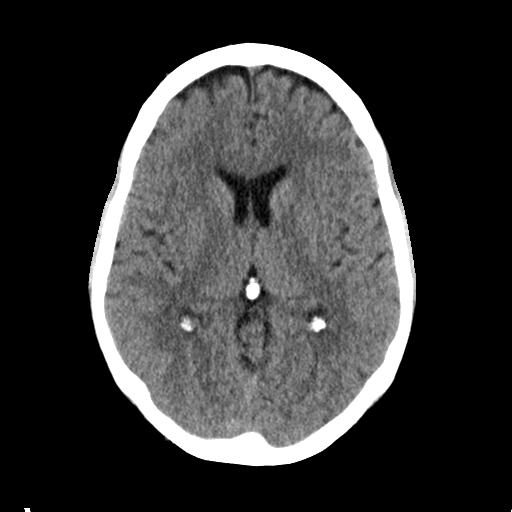
[im 20/32  brain]
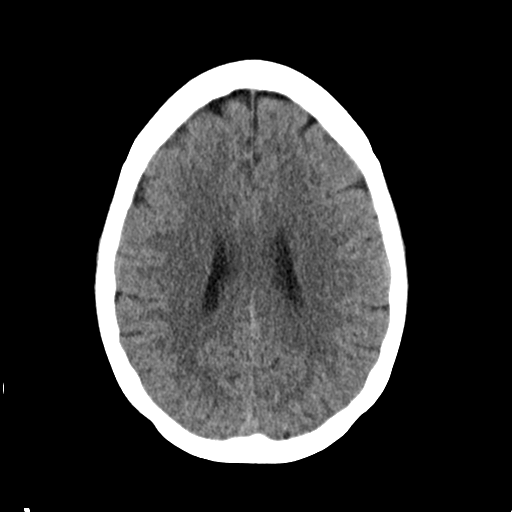
[im 20/32  bone]
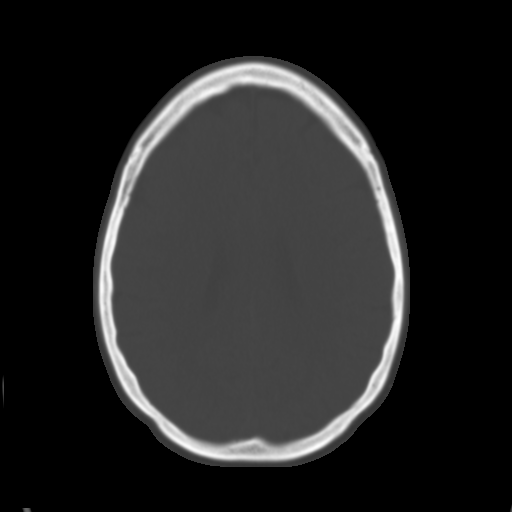
[im 24/32  brain]
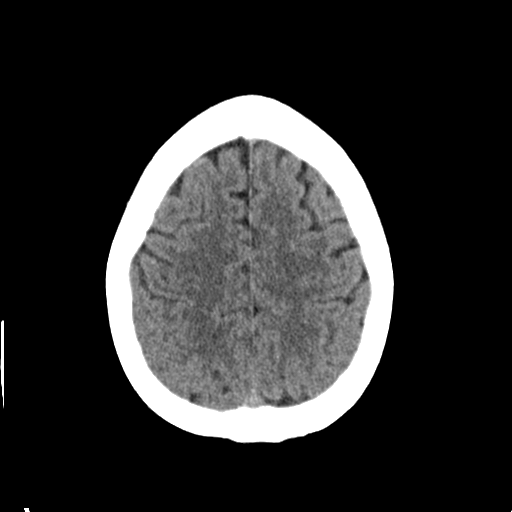
[im 28/32  brain]
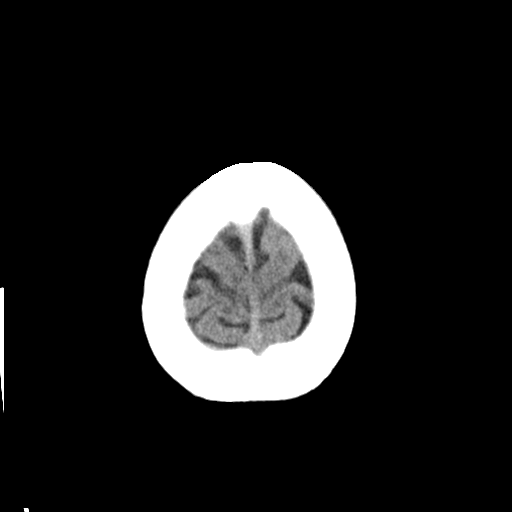

[Series 3: head bone · axial · 0.42mm/px · z∈[-136,-80]mm · 4 of 80 slices shown]
[im 8/80  bone]
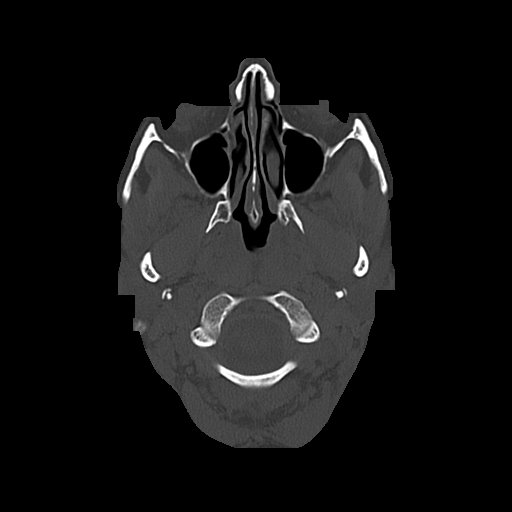
[im 16/80  bone]
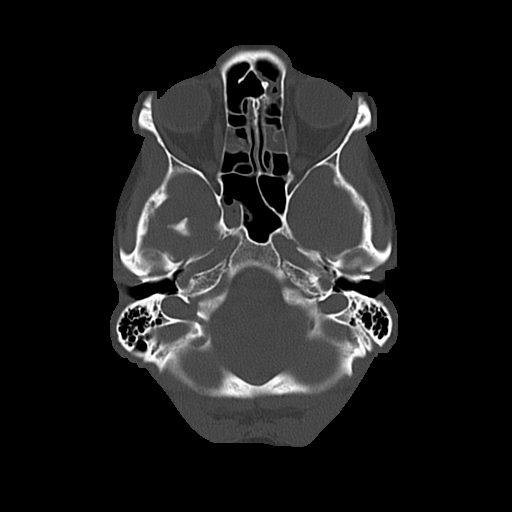
[im 24/80  bone]
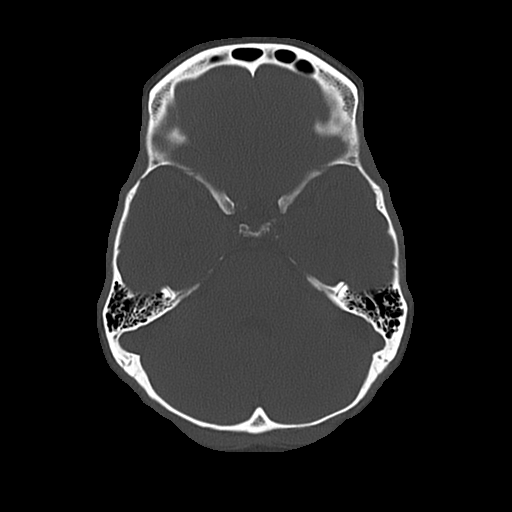
[im 36/80  bone]
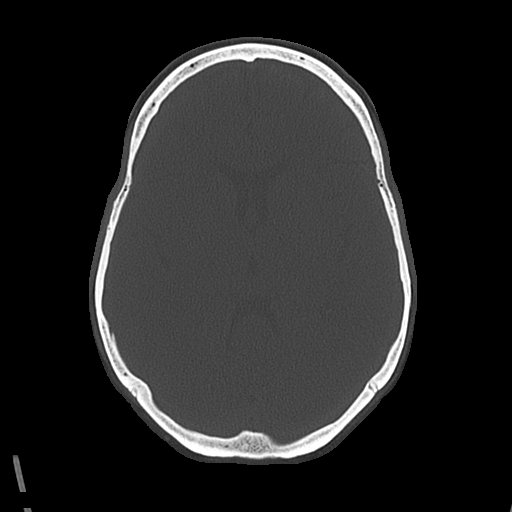

[Series 4: coronal soft · coronal · 0.34mm/px · 3 of 68 slices shown]
[im 23/68  brain]
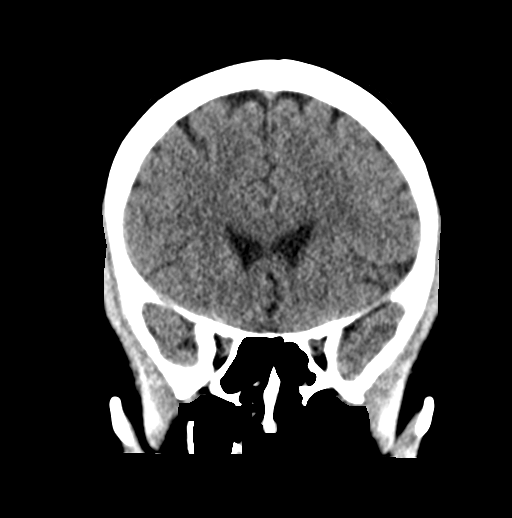
[im 30/68  brain]
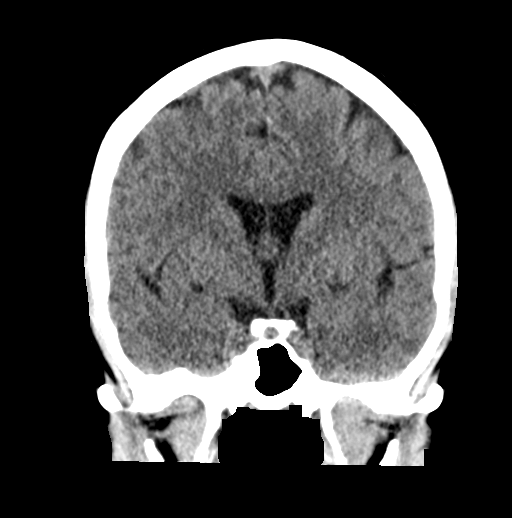
[im 38/68  brain]
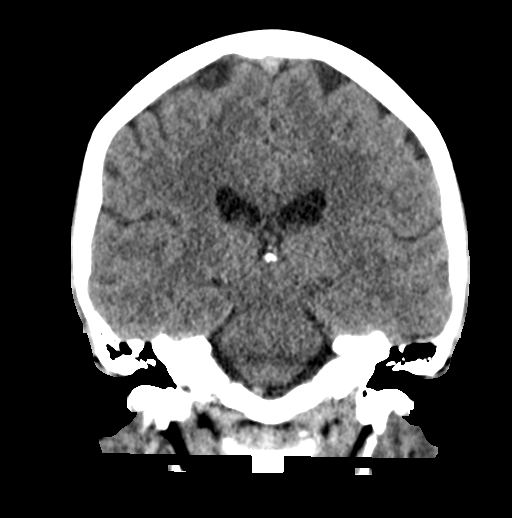

[Series 5: sagittal soft · sagittal · 0.36mm/px · 3 of 56 slices shown]
[im 19/56  brain]
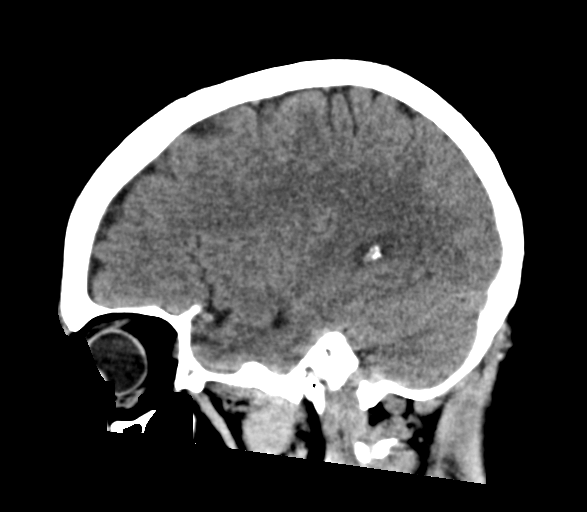
[im 28/56  brain]
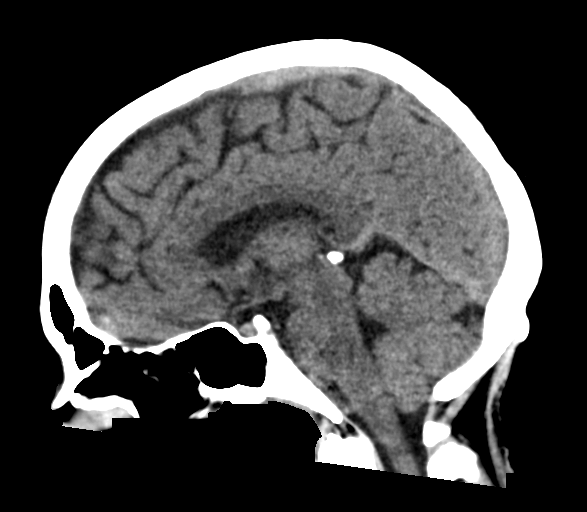
[im 37/56  brain]
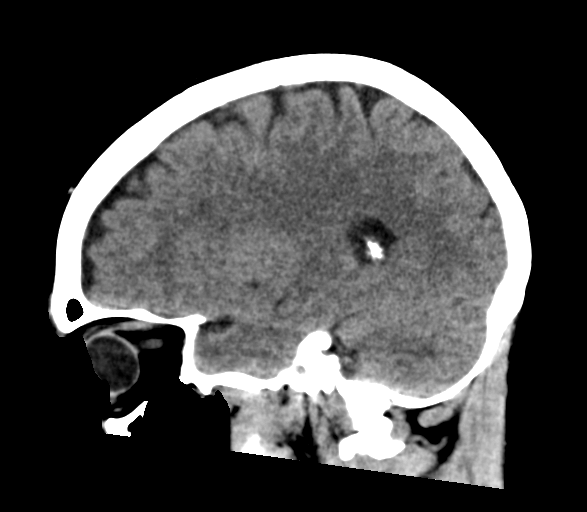

[17 of 47 positions shown; findings below may reference images not displayed]

FINDINGS: Brain: The ventricles and sulci appropriate size for patient's age.
The gray-white matter discrimination is preserved. Small low
attenuating foci in the region of the basal ganglia bilaterally,
likely perivascular spaces. There is no acute intracranial
hemorrhage. No mass effect midline shift. No extra-axial fluid
collection.

Vascular: No hyperdense vessel or unexpected calcification.

Skull: Normal. Negative for fracture or focal lesion.

Sinuses/Orbits: There is diffuse mucoperiosteal thickening of
paranasal sinuses. No air-fluid level. Mastoid air cells are clear.

Other: None
IMPRESSION: 1. No acute intracranial pathology.
2. Paranasal sinus disease.
